# Patient Record
Sex: Female | Born: 1980 | Race: Black or African American | Hispanic: No | Marital: Single | Smoking: Current every day smoker
Health system: Southern US, Community
[De-identification: ages and names within clinical notes are randomized; demographics above are authoritative.]

## PROBLEM LIST (undated history)

## (undated) HISTORY — PX: ABDOMINAL HYSTERECTOMY: SHX81

---

## 2005-02-10 ENCOUNTER — Emergency Department: Payer: Self-pay | Admitting: Emergency Medicine

## 2009-02-17 ENCOUNTER — Emergency Department: Payer: Self-pay | Admitting: Emergency Medicine

## 2009-02-23 ENCOUNTER — Emergency Department: Payer: Self-pay | Admitting: Unknown Physician Specialty

## 2009-09-20 ENCOUNTER — Emergency Department: Payer: Self-pay | Admitting: Emergency Medicine

## 2011-04-26 IMAGING — CT CT ABD-PELV W/O CM
1 of 2 series · 15 of 32 positions shown, 19 images · non-contrast
Comparison: none

REASON FOR EXAM: (1) right flank and RLQ pain with microhematuria; (2)
right flank and RLQ pain w
COMMENTS:

[Series 2: stone · axial · 0.63mm/px · z∈[-117,+300]mm · 15 of 153 slices shown, 19 images]
[im 7/153  soft-tissue]
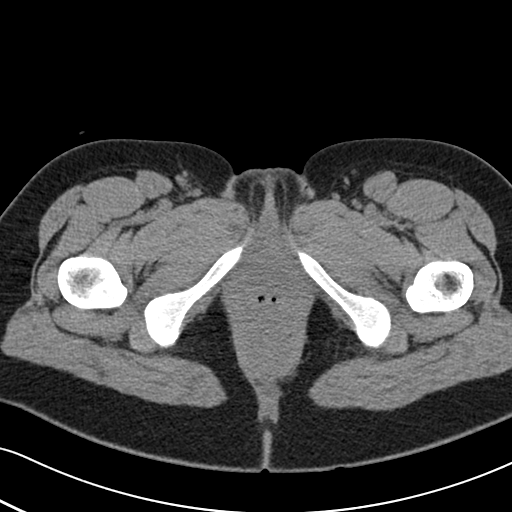
[im 7/153  bone]
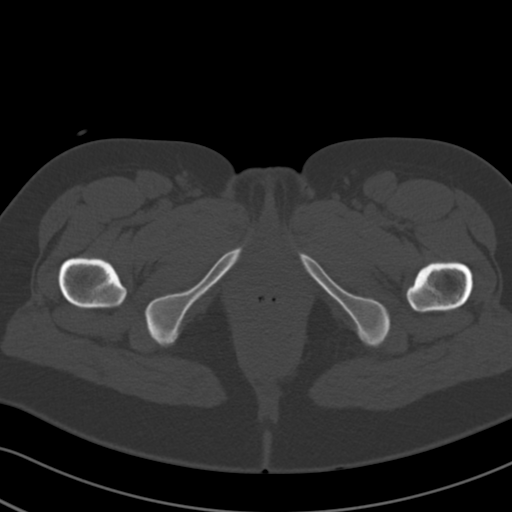
[im 19/153  soft-tissue]
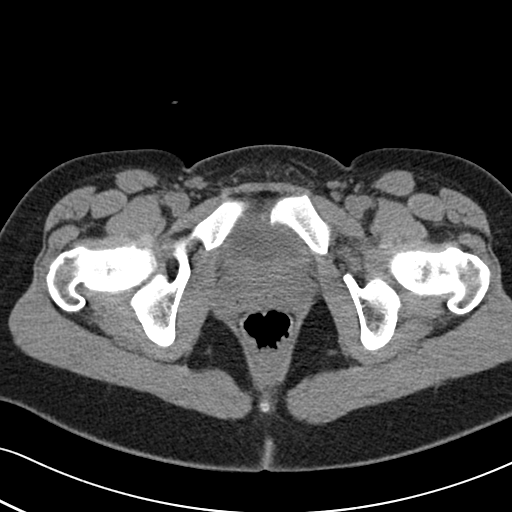
[im 31/153  soft-tissue]
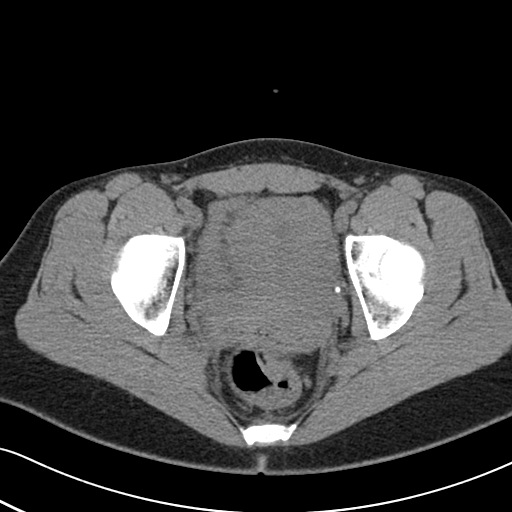
[im 43/153  soft-tissue]
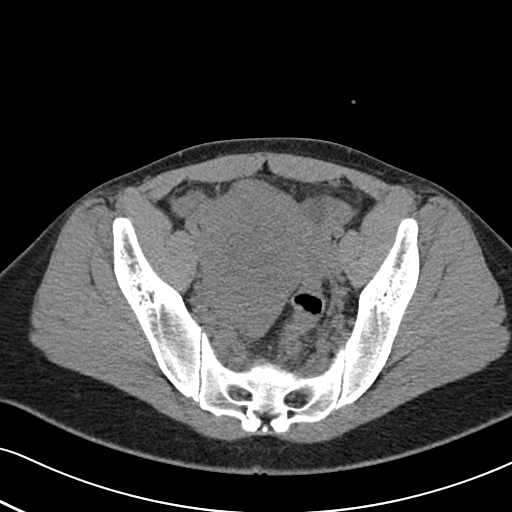
[im 55/153  soft-tissue]
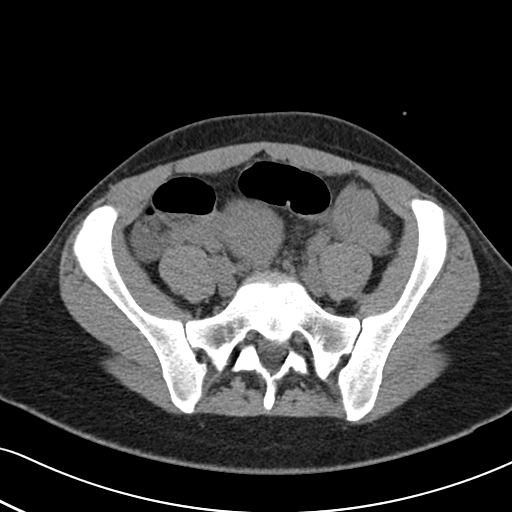
[im 67/153  soft-tissue]
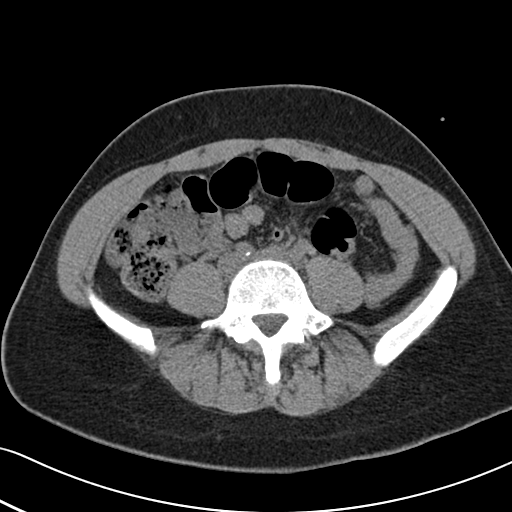
[im 80/153  soft-tissue]
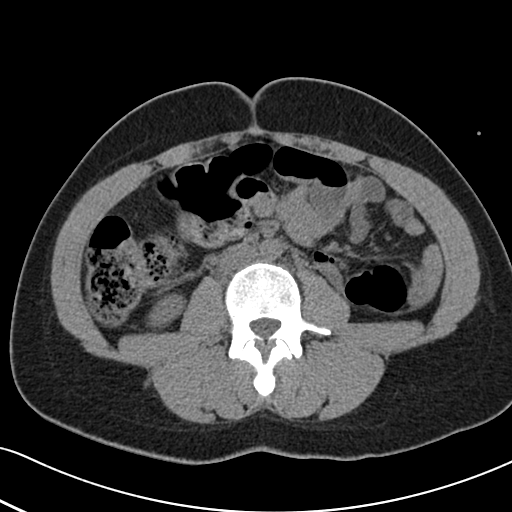
[im 86/153  soft-tissue]
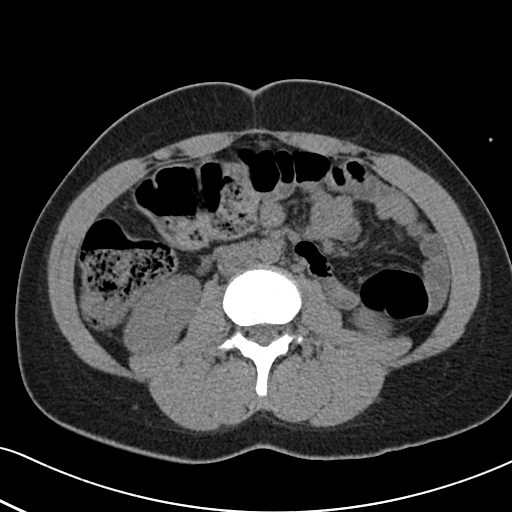
[im 98/153  soft-tissue]
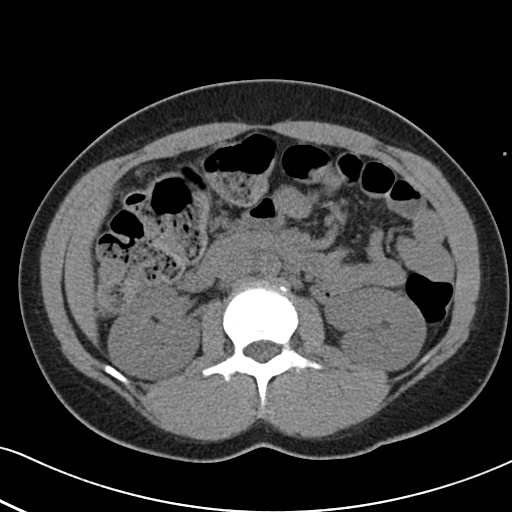
[im 98/153  bone]
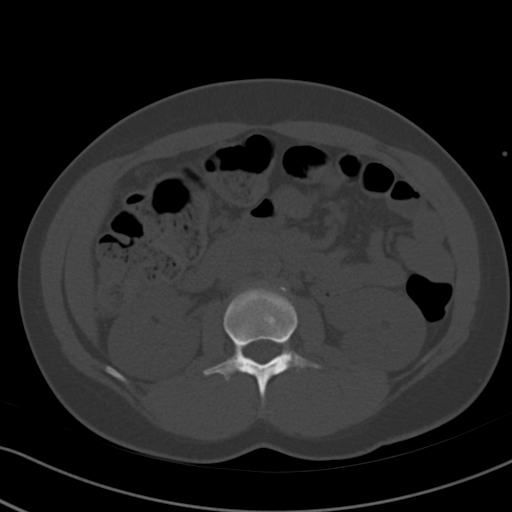
[im 110/153  soft-tissue]
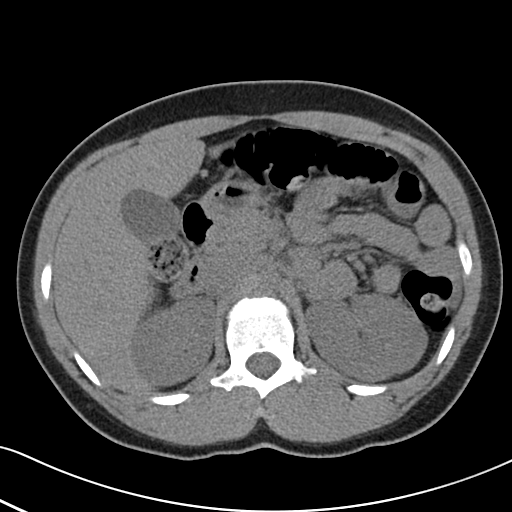
[im 122/153  soft-tissue]
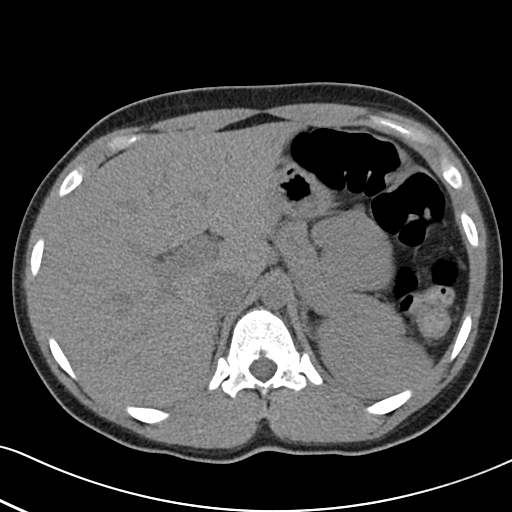
[im 128/153  lung]
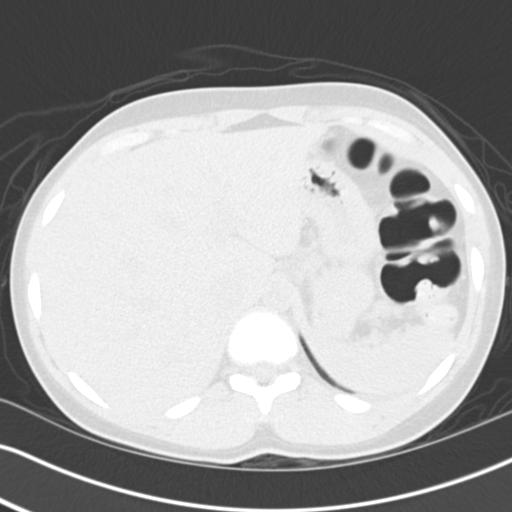
[im 134/153  soft-tissue]
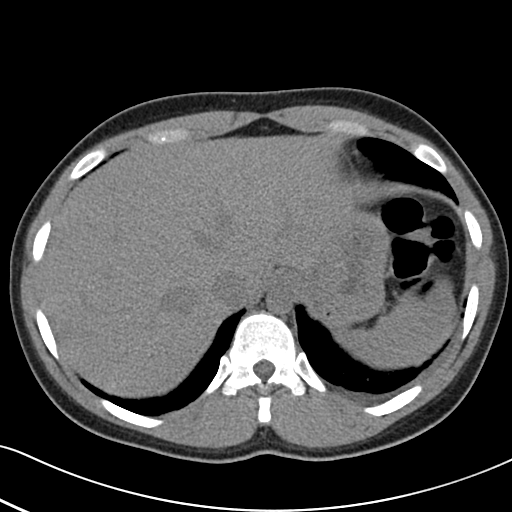
[im 134/153  lung]
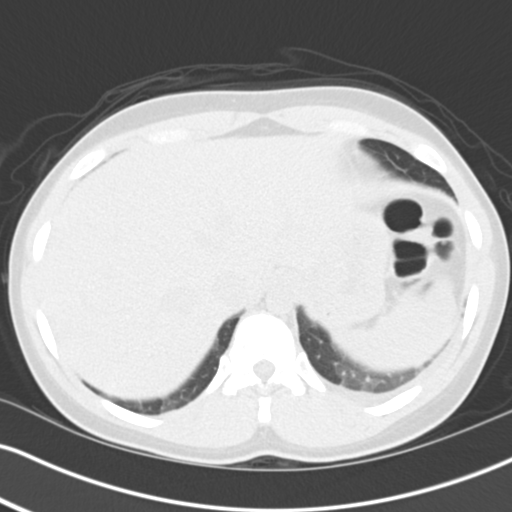
[im 140/153  lung]
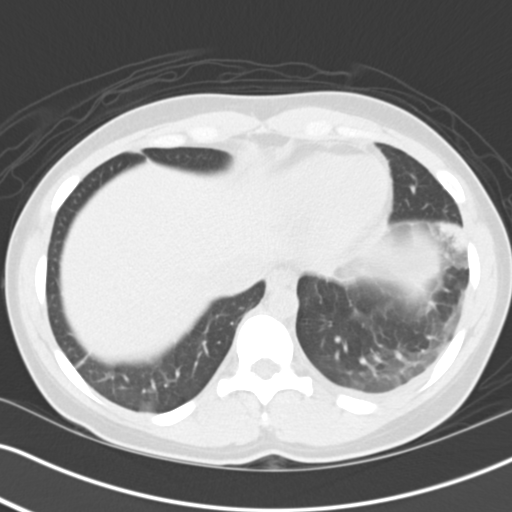
[im 146/153  soft-tissue]
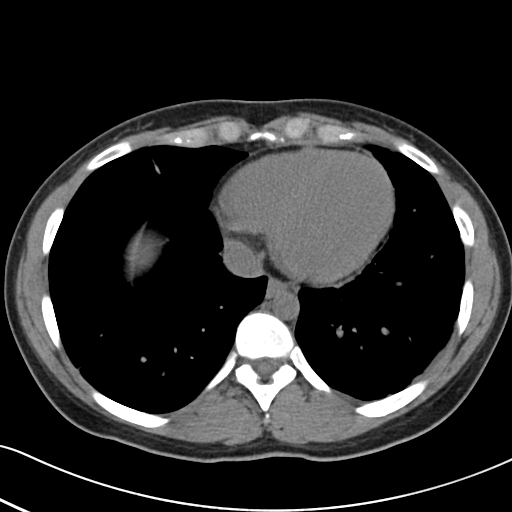
[im 146/153  lung]
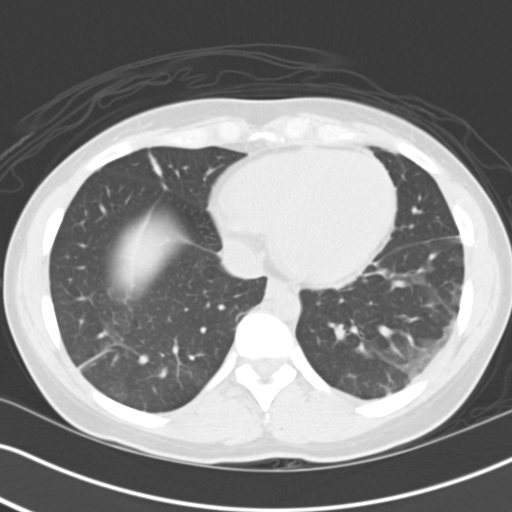

[15 of 32 positions shown; findings below may reference images not displayed]

PROCEDURE:     CT  - CT ABDOMEN AND PELVIS W[DATE] [DATE]

RESULT:     Renal stone protocol CT of the abdomen and pelvis is performed.
Images are reconstructed at 3 mm slice thickness in the axial plane. There
is no previous examination for comparison.

The kidneys show no obstruction or stones. No ureteral dilation is seen. No
gallstones of a radiopaque nature are evident. There is a moderate amount of
fecal material within the right colon. There is no abnormal bowel
distention. The liver, spleen, pancreas and adrenal glands appear
unremarkable. The aorta is normal in caliber with some scattered
atherosclerotic calcification in the right common iliac artery and in the
area of the internal iliac on the right. The uterus appears to be prominent.
Ovarian cysts are present but do not appear to be pathologically enlarged.
The largest of these approaches 2 cm in diameter on the left on image 110.
The urinary bladder is decompressed. There multiple calcifications in the
pelvic region especially in the area of the distal right ureter. It would be
difficult to exclude a nonobstructing or minimally obstructing distal right
ureteral calculus. There is no evidence of appendicitis or other acute
inflammation.
IMPRESSION: 1. Prominence of the uterus. Ovarian cysts.
2. Minimal atherosclerotic calcification.
3. LV calcifications suggestive of phleboliths. The possibility of a
nonobstructing distal right ureteral calculus is difficult to exclude. There
is no nephrolithiasis or significant obstructive change demonstrated.

## 2011-07-16 ENCOUNTER — Emergency Department: Payer: Self-pay | Admitting: Emergency Medicine

## 2011-08-06 ENCOUNTER — Emergency Department: Payer: Self-pay | Admitting: Unknown Physician Specialty

## 2013-02-18 IMAGING — CR RIGHT ANKLE - COMPLETE 3+ VIEW
1 series · 5 of 5 positions shown · non-contrast
Comparison: none

REASON FOR EXAM: pain/ injury    Flex 4
COMMENTS:   LMP: Three weeks ago

[Series 1: view not recorded · 0.17mm/px · 5 of 5 slices shown]
[im 1/5]
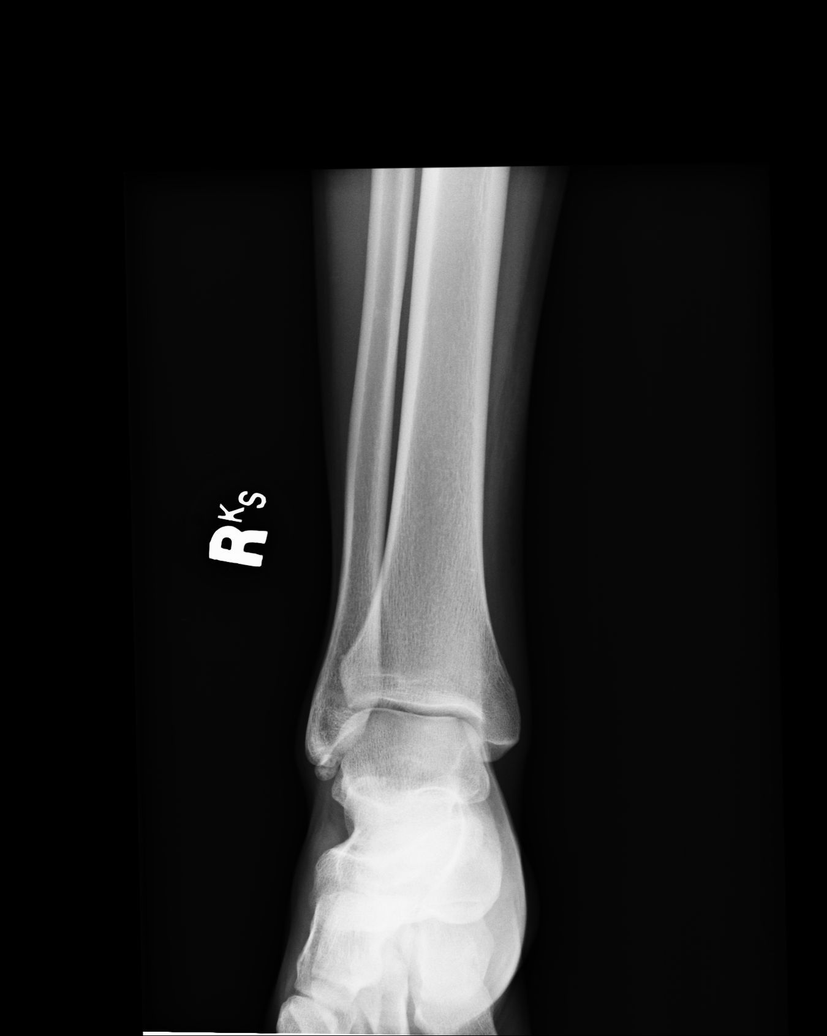
[im 2/5]
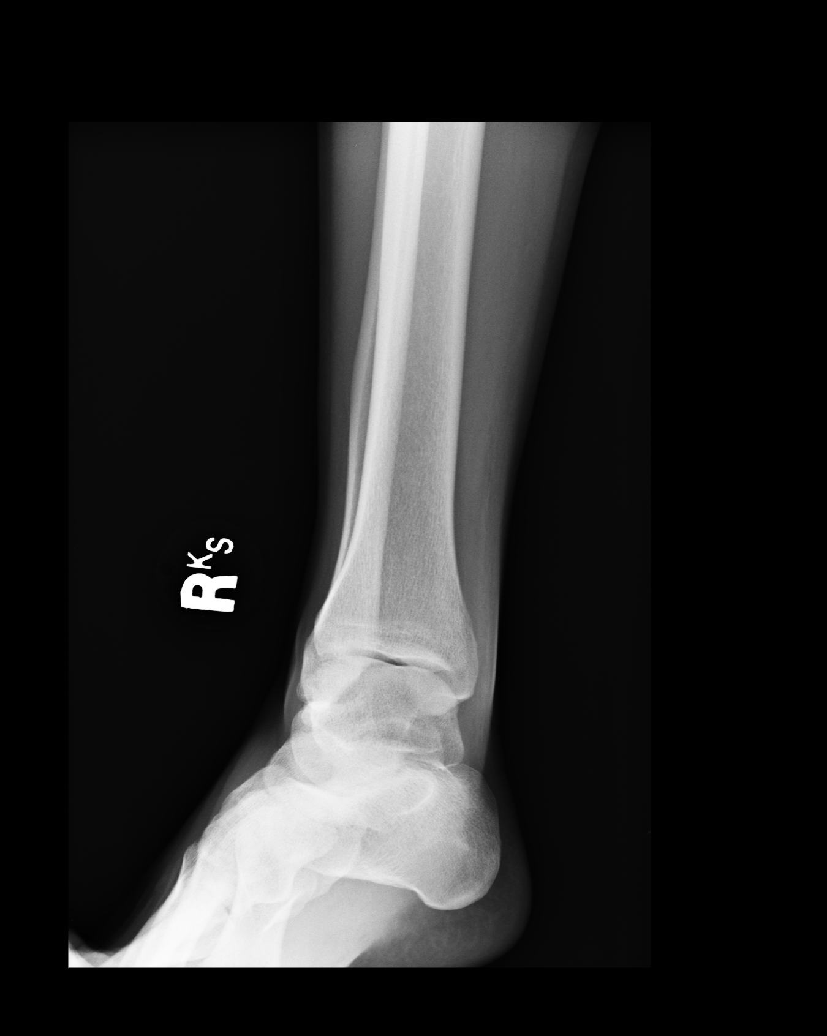
[im 3/5]
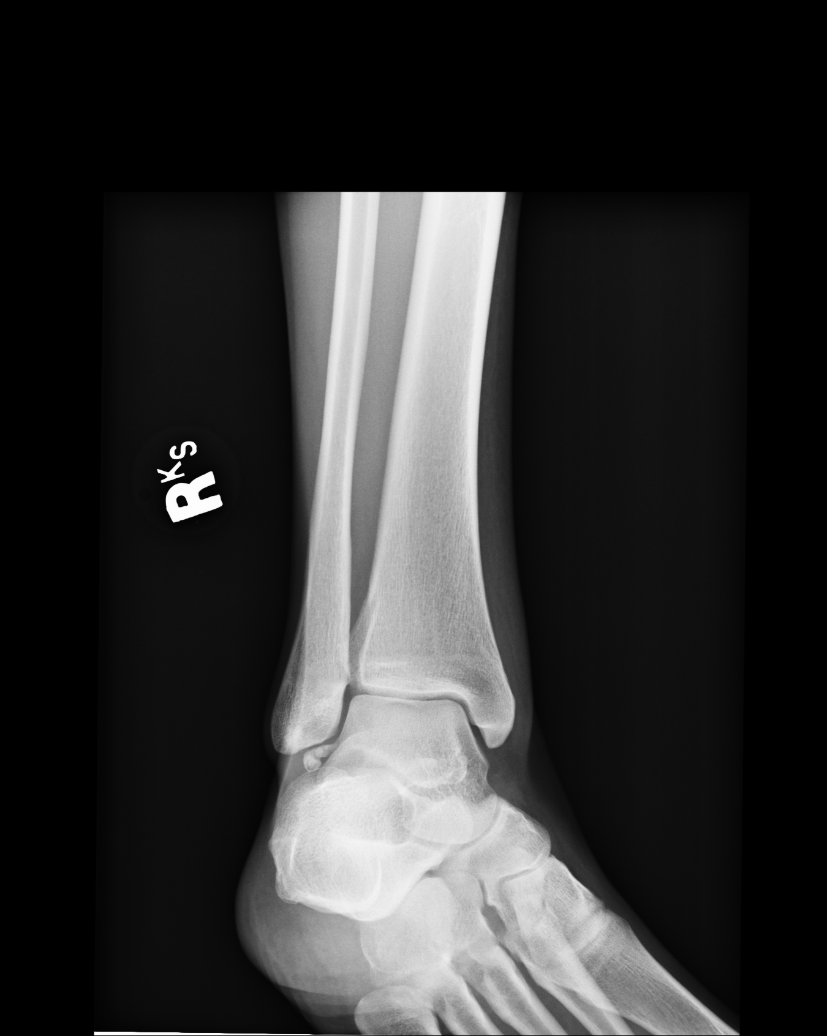
[im 4/5]
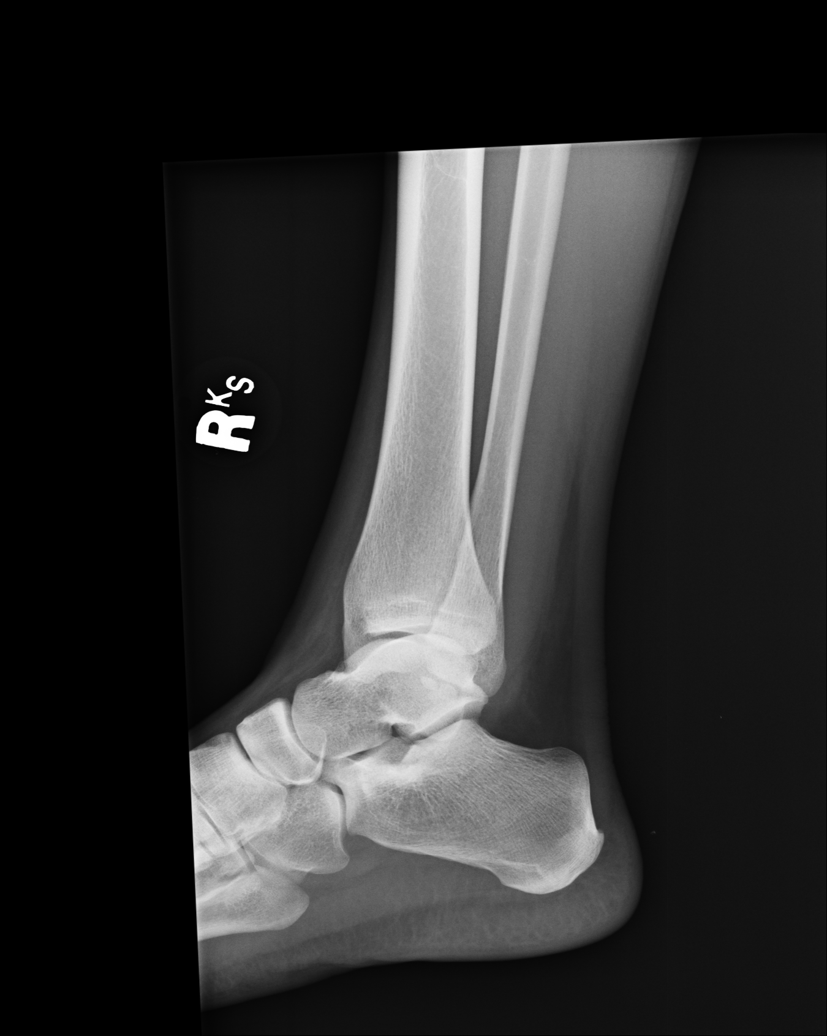
[im 5/5]
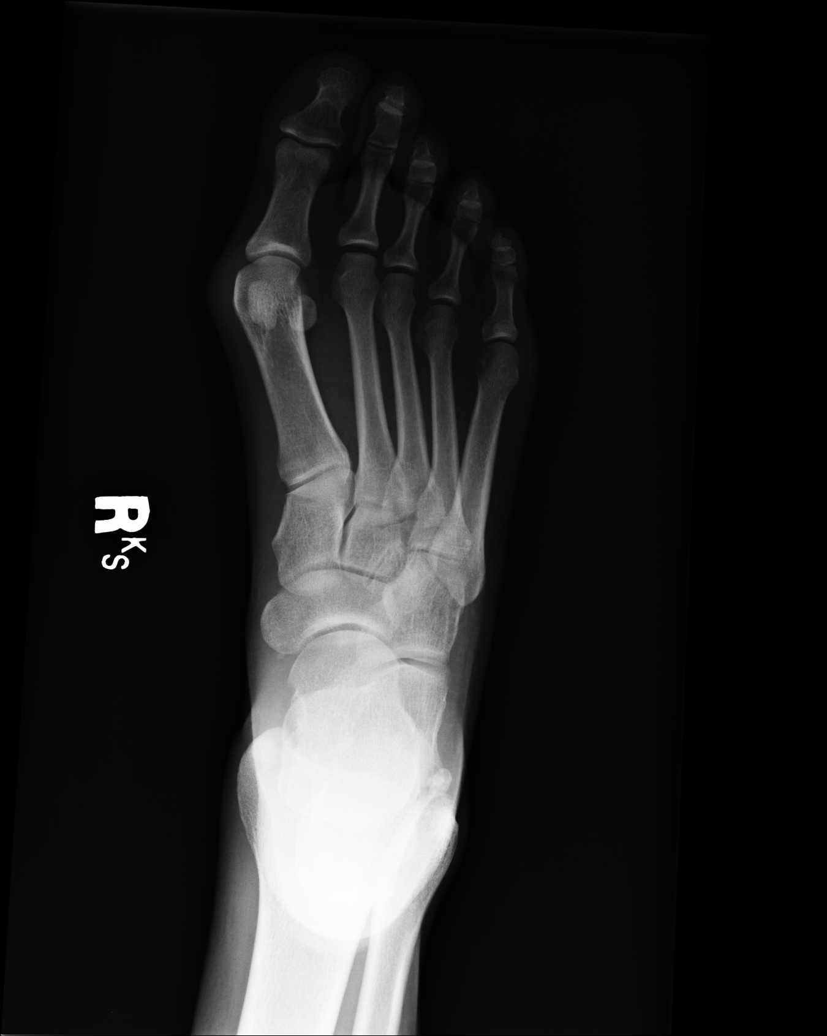

[5 of 5 positions shown; findings below may reference images not displayed]

PROCEDURE:     DXR - DXR ANKLE RIGHT COMPLETE  - July 16, 2011 [DATE]

RESULT:     Five views of the left ankle are submitted. The ankle joint
mortise is preserved. The talar dome is intact. There is bony density
inferior to the lateral malleolus which appears chronic and may reflect an
old avulsion or accessory ossicle. I do not see evidence of an acute
fracture nor dislocation. The overlying soft tissues are within the limits
of normal.
IMPRESSION: I see no acute bony abnormality of the right ankle.

## 2017-07-24 ENCOUNTER — Ambulatory Visit
Admission: EM | Admit: 2017-07-24 | Discharge: 2017-07-24 | Disposition: A | Discharge status: Home / Self Care | Payer: Self-pay | Attending: Family Medicine | Admitting: Family Medicine

## 2017-07-24 ENCOUNTER — Ambulatory Visit (INDEPENDENT_AMBULATORY_CARE_PROVIDER_SITE_OTHER): Payer: Self-pay

## 2017-07-24 ENCOUNTER — Encounter: Payer: Self-pay | Admitting: *Deleted

## 2017-07-24 DIAGNOSIS — R2241 Localized swelling, mass and lump, right lower limb: Secondary | ICD-10-CM

## 2017-07-24 DIAGNOSIS — M76811 Anterior tibial syndrome, right leg: Secondary | ICD-10-CM

## 2017-07-24 DIAGNOSIS — M79671 Pain in right foot: Secondary | ICD-10-CM

## 2017-07-24 MED ORDER — NAPROXEN 500 MG PO TABS: 500.0000 mg | ORAL_TABLET | Freq: Two times a day (BID) | ORAL | 0 refills | Status: DC

## 2017-07-24 NOTE — ED Provider Notes (Signed)
[facility]    CSN: [id number] Arrival date & time: 07/24/17  0865     History   Chief Complaint Chief Complaint  Patient presents with  . Foot Pain    HPI [name] is a 36 y.o. female.   HPI    36 year old female who presents with right foot pain that she's had for 1 week. There is no history of trauma. She has not changed her activities. 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 Is on her feet all night long.She states that she has been taking Profen 40 milligrams at a time and has "finished a bottle"over this last week. Is that she has been able to walk on it up until last night when it became much more severe and bothersome.she has noticed a dark area over the dorsum of her foot on the third and fourth metatarsal bases. She is most tender. She also has pain extending up her distal  leg but does not involve the malleolus.       History reviewed. No pertinent past medical history.  There are no active problems to display for this patient.   History reviewed. No pertinent surgical history.  OB History    No data available       Home Medications    Prior to Admission medications   Medication Sig Start Date End Date Taking? Authorizing Provider  naproxen (NAPROSYN) 500 MG tablet Take 1 tablet (500 mg total) by mouth 2 (two) times daily with a meal. 07/24/17   Lutricia Feil, PA-C    Family History Family History  Problem Relation Age of Onset  . COPD Mother   . Hypertension Mother   . Diabetes Mother   . CAD Father     Social History Social History  Substance Use Topics  . Smoking status: Current Every Day Smoker  . Smokeless tobacco: Never Used  . Alcohol use Yes     Allergies   Patient has no known allergies.   Review of Systems Review of Systems  Constitutional: Positive for activity change. Negative for appetite change, chills, fatigue and fever.  Musculoskeletal: Positive for arthralgias and gait  problem.  All other systems reviewed and are negative.    Physical Exam Triage Vital Signs ED Triage Vitals  Enc Vitals Group     BP 07/24/17 0954 124/84     Pulse Rate 07/24/17 0954 88     Resp 07/24/17 0954 16     Temp 07/24/17 0954 98.4 F (36.9 C)     Temp Source 07/24/17 0954 Oral     SpO2 07/24/17 0954 100 %     Weight 07/24/17 0955 150 lb (68 kg)     Height 07/24/17 0955  (1.676 m)     Head Circumference --      Peak Flow --      Pain Score 07/24/17 0956 10     Pain Loc --      Pain Edu? --      Excl. in GC? --    No data found.   Updated Vital Signs BP 124/84 (BP Location: Left Arm)   Pulse 88   Temp 98.4 F (36.9 C) (Oral)   Resp 16   Ht  (1.676 m)   Wt 150 lb (68 kg)   LMP 07/16/2017 Comment: denies preg  SpO2 100%   BMI 24.21 kg/m   Visual Acuity Right Eye Distance:   Left Eye Distance:  Bilateral Distance:    Right Eye Near:   Left Eye Near:    Bilateral Near:     Physical Exam  Constitutional: She is oriented to person, place, and time. She appears well-developed and well-nourished. No distress.  HENT:  Head: Normocephalic.  Eyes: Pupils are equal, round, and reactive to light.  Neck: Normal range of motion.  Pulmonary/Chest: Effort normal.  Musculoskeletal: Normal range of motion. She exhibits tenderness.  examination of the right foot shows discoloration over the dorsum of the foot extending over the third and fourth metatarsal bases. There is no significant edema. There is no breaks in the skin. Range of motion of her ankleis near normal, though complaining of discomfort at the extremes particularly with dorsi flexion. Subtalar motion is intact. Also haspain the anterior leg but not involving malleoli.. This more at the junction of the musculotendinous area. There is no warmth appreciated.  Neurological: She is alert and oriented to person, place, and time.  Skin: Skin is warm and dry. She is not diaphoretic.  Psychiatric: She  has a normal mood and affect. Her behavior is normal. Judgment and thought content normal.  Nursing note and vitals reviewed.    UC Treatments / Results  Labs (all labs ordered are listed, but only abnormal results are displayed) Labs Reviewed - No data to display  EKG  EKG Interpretation None       Radiology Dg Foot Complete Right  Result Date: 07/24/2017 CLINICAL DATA:  Dorsal foot pain and erythema. EXAM: RIGHT FOOT COMPLETE - 3+ VIEW COMPARISON:  Right ankle x-rays dated July 16, 2011. FINDINGS: No acute fracture or malalignment. Mild hallux valgus deformity. Joint spaces are preserved. Bone mineralization is normal. Mild dorsal soft tissue swelling over the midfoot. IMPRESSION: Mild dorsal midfoot soft tissue swelling. No acute osseous abnormality. Electronically Signed   By: Obie Dredge M.D.   On: 07/24/2017 11:03    Procedures Procedures (including critical care time)  Medications Ordered in UC Medications - No data to display   Initial Impression / Assessment and Plan / UC Course  I have reviewed the triage vital signs and the nursing notes.  Pertinent labs & imaging results that were available during my care of the patient were reviewed by me and considered in my medical decision making (see chart for details).     Plan: 1. Test/x-ray results and diagnosis reviewed with patient 2. rx as per orders; risks, benefits, potential side effects reviewed with patient 3. Recommend supportive treatment with rest and symptom avoidance. Using an ankle brace for support only for personal care and during sleep. Use of Naprosyn twice daily with food.If not improving in a week or to follow-up with the emerg orthopedics. 4. F/u prn if symptoms worsen or don't improve   Final Clinical Impressions(s) / UC Diagnoses   Final diagnoses:  Anterior tibial tendonitis, right    New Prescriptions New Prescriptions   NAPROXEN (NAPROSYN) 500 MG TABLET    Take 1 tablet (500 mg  total) by mouth 2 (two) times daily with a meal.     Controlled Substance Prescriptions [place] Controlled Substance Registry consulted? Not Applicable   Lutricia Feil, PA-C 07/24/17 1215

## 2017-07-24 NOTE — ED Triage Notes (Signed)
Gradual onset right foot pain, edema, and redness x1 week. Denies injury.

## 2019-06-25 ENCOUNTER — Ambulatory Visit
Admission: EM | Admit: 2019-06-25 | Discharge: 2019-06-25 | Disposition: A | Discharge status: Home / Self Care | Payer: Self-pay | Attending: Family Medicine | Admitting: Family Medicine

## 2019-06-25 ENCOUNTER — Encounter: Payer: Self-pay | Admitting: Emergency Medicine

## 2019-06-25 ENCOUNTER — Other Ambulatory Visit: Payer: Self-pay

## 2019-06-25 DIAGNOSIS — Z042 Encounter for examination and observation following work accident: Secondary | ICD-10-CM

## 2019-06-25 DIAGNOSIS — W268XXA Contact with other sharp object(s), not elsewhere classified, initial encounter: Secondary | ICD-10-CM

## 2019-06-25 DIAGNOSIS — S61512A Laceration without foreign body of left wrist, initial encounter: Secondary | ICD-10-CM

## 2019-06-25 DIAGNOSIS — Z23 Encounter for immunization: Secondary | ICD-10-CM

## 2019-06-25 MED ORDER — TETANUS-DIPHTH-ACELL PERTUSSIS 5-2.5-18.5 LF-MCG/0.5 IM SUSP
0.5000 mL | Freq: Once | INTRAMUSCULAR | Status: AC
  Administered 2019-06-25: 0.5 mL via INTRAMUSCULAR

## 2019-06-25 NOTE — Discharge Instructions (Signed)
Keep area clean  Over the counter topical antibiotic twice daily Monitor for any signs or symptoms of infection Follow up in 7 days for suture removal

## 2019-06-25 NOTE — ED Triage Notes (Signed)
Patient in today for a W/C injury 06/25/19. Patient states she was cutting a box with a box cutter and when she got to the end of the box she cut her left wrist. Patient states her last tetanus was ~2 months ago at [facility].

## 2019-06-25 NOTE — ED Provider Notes (Signed)
[facility]    CSN: [id number] Arrival date & time: 06/25/19  1737      History   Chief Complaint Chief Complaint  Patient presents with  . Extremity Laceration    W/C left wrist    HPI Makayla Bowman is a 38 y.o. female.   38 yo female with a c/o left wrist laceration after cutting it accidentally with a box cutter at work. States she was cutting a box when she accidentally cut her wrist. Does not recall her last tetanus. Patient states she's otherwise healthy.      History reviewed. No pertinent past medical history.  There are no active problems to display for this patient.   Past Surgical History:  Procedure Laterality Date  . ABDOMINAL HYSTERECTOMY      OB History   No obstetric history on file.      Home Medications    Prior to Admission medications   Medication Sig Start Date End Date Taking? Authorizing Provider  naproxen (NAPROSYN) 500 MG tablet Take 1 tablet (500 mg total) by mouth 2 (two) times daily with a meal. 07/24/17  Yes Lutricia Feiloemer, William P, PA-C    Family History Family History  Problem Relation Age of Onset  . COPD Mother   . Hypertension Mother   . Diabetes Mother   . CAD Father     Social History Social History   Tobacco Use  . Smoking status: Current Every Day Smoker    Packs/day: 0.50    Years: 5.00    Pack years: 2.50    Types: Cigarettes  . Smokeless tobacco: Never Used  Substance Use Topics  . Alcohol use: Yes    Comment: social  . Drug use: No     Allergies   Patient has no known allergies.   Review of Systems Review of Systems   Physical Exam Triage Vital Signs ED Triage Vitals [06/25/19 1758]  Enc Vitals Group     BP 131/88     Pulse Rate 77     Resp 18     Temp 98.3 F (36.8 C)     Temp Source Oral     SpO2 100 %     Weight 151 lb (68.5 kg)     Height 5\' 6"  (1.676 m)     Head Circumference      Peak Flow      Pain Score 6     Pain Loc      Pain Edu?      Excl. in GC?    No  data found.  Updated Vital Signs BP 131/88 (BP Location: Right Arm)   Pulse 77   Temp 98.3 F (36.8 C) (Oral)   Resp 18   Ht 5\' 6"  (1.676 m)   Wt 68.5 kg   LMP 07/16/2017 Comment: denies preg  SpO2 100%   BMI 24.37 kg/m   Visual Acuity Right Eye Distance:   Left Eye Distance:   Bilateral Distance:    Right Eye Near:   Left Eye Near:    Bilateral Near:     Physical Exam Vitals signs and nursing note reviewed.  Constitutional:      General: She is not in acute distress.    Appearance: She is not toxic-appearing or diaphoretic.  Musculoskeletal:     Left wrist: She exhibits laceration (3cm laceration on inner (palmar) aspect of wrist; normal range of motion and strength; neurovascularly intact). She exhibits normal range of motion, no tenderness, no  bony tenderness, no swelling, no effusion and no crepitus.  Neurological:     Mental Status: She is alert.      UC Treatments / Results  Labs (all labs ordered are listed, but only abnormal results are displayed) Labs Reviewed - No data to display  EKG   Radiology No results found.  Procedures Laceration Repair  Date/Time: 06/25/2019 8:36 PM Performed by: Norval Gable, MD Authorized by: Norval Gable, MD   Consent:    Consent obtained:  Verbal   Consent given by:  Patient   Risks discussed:  Infection, need for additional repair, nerve damage, poor wound healing, poor cosmetic result, pain, retained foreign body, tendon damage and vascular damage   Alternatives discussed:  No treatment Anesthesia (see MAR for exact dosages):    Anesthesia method:  Topical application and local infiltration   Topical anesthetic:  LET   Local anesthetic:  Lidocaine 1% w/o epi Laceration details:    Location:  Hand   Hand location:  L wrist   Length (cm):  3 Repair type:    Repair type:  Simple Pre-procedure details:    Preparation:  Patient was prepped and draped in usual sterile fashion Exploration:    Hemostasis  achieved with:  LET and direct pressure   Wound exploration: wound explored through full range of motion and entire depth of wound probed and visualized     Wound extent: areolar tissue violated     Contaminated: no   Treatment:    Area cleansed with:  Betadine   Amount of cleaning:  Standard   Irrigation solution:  Sterile water   Irrigation method:  Syringe   Foreign body removal: no foreign bodies visualized.   Skin repair:    Repair method:  Sutures   Suture size:  5-0   Suture material:  Nylon   Suture technique:  Simple interrupted   Number of sutures:  5 Approximation:    Approximation:  Close Post-procedure details:    Dressing:  Antibiotic ointment and non-adherent dressing   Patient tolerance of procedure:  Tolerated well, no immediate complications   (including critical care time)  Medications Ordered in UC Medications  Tdap (BOOSTRIX) injection 0.5 mL (0.5 mLs Intramuscular Given 06/25/19 1912)    Initial Impression / Assessment and Plan / UC Course  I have reviewed the triage vital signs and the nursing notes.  Pertinent labs & imaging results that were available during my care of the patient were reviewed by me and considered in my medical decision making (see chart for details).      Final Clinical Impressions(s) / UC Diagnoses   Final diagnoses:  Laceration of left wrist, initial encounter     Discharge Instructions     Keep area clean  Over the counter topical antibiotic twice daily Monitor for any signs or symptoms of infection Follow up in 7 days for suture removal    ED Prescriptions    None      1. diagnosis reviewed with patient 2. tdap given 3. Recommend supportive treatment with routine wound care 4. Follow-up in 7 days for suture removal or sooner prn   Controlled Substance Prescriptions [place] Controlled Substance Registry consulted? Not Applicable   Norval Gable, MD 06/25/19 2038

## 2019-07-02 ENCOUNTER — Other Ambulatory Visit: Payer: Self-pay

## 2019-07-02 ENCOUNTER — Ambulatory Visit
Admission: EM | Admit: 2019-07-02 | Discharge: 2019-07-02 | Disposition: A | Discharge status: Home / Self Care | Payer: Self-pay

## 2019-07-02 DIAGNOSIS — S61512D Laceration without foreign body of left wrist, subsequent encounter: Secondary | ICD-10-CM

## 2019-07-02 DIAGNOSIS — Z4802 Encounter for removal of sutures: Secondary | ICD-10-CM

## 2019-07-02 NOTE — ED Provider Notes (Signed)
[facility] ____________________________________________  Time seen: Approximately 6:45 PM  I have reviewed the triage vital signs and the nursing notes.   HISTORY  Chief Complaint Suture / Staple Removal   HPI Makayla Bowman is a 38 y.o. female presenting for suture removal and wound reevaluation.  Patient was seen in urgent care 1 week ago after sustaining a laceration to left wrist from a box cutter from work.  Worker's Compensation injury.  States area has been healing well without complication.  Denies fevers, drainage, redness, pain, paresthesias or decreased range of motion.  Denies other complaints.  Tetanus immunization up-to-date.    No past medical history on file.  There are no active problems to display for this patient.   Past Surgical History:  Procedure Laterality Date  . ABDOMINAL HYSTERECTOMY       No current facility-administered medications for this encounter.   Current Outpatient Medications:  .  naproxen (NAPROSYN) 500 MG tablet, Take 1 tablet (500 mg total) by mouth 2 (two) times daily with a meal., Disp: 60 tablet, Rfl: 0  Allergies Patient has no known allergies.  Family History  Problem Relation Age of Onset  . COPD Mother   . Hypertension Mother   . Diabetes Mother   . CAD Father     Social History Social History   Tobacco Use  . Smoking status: Current Every Day Smoker    Packs/day: 0.50    Years: 5.00    Pack years: 2.50    Types: Cigarettes  . Smokeless tobacco: Never Used  Substance Use Topics  . Alcohol use: Yes    Comment: social  . Drug use: No    Review of Systems Constitutional: No fever/chills Skin: Sutures to left wrist.  ____________________________________________   PHYSICAL EXAM:  VITAL SIGNS: ED Triage Vitals [07/02/19 1828]  Enc Vitals Group     BP 127/81     Pulse Rate 88     Resp 18     Temp 98.3 F (36.8 C)     Temp Source Oral     SpO2 100 %     Weight      Height      Head  Circumference      Peak Flow      Pain Score 0     Pain Loc      Pain Edu?      Excl. in GC?     Constitutional: Alert and oriented. Well appearing and in no acute distress. ENT      Head: Normocephalic and atraumatic. Cardiovascular:   Good peripheral circulation. Respiratory: Normal respiratory effort without tachypnea nor retractions.  Musculoskeletal: Steady gait. Neurologic:  Normal speech and language. Skin:  Skin is warm, dry.  Except: Volar aspect left wrist healing laceration with 5 sutures intact, overall well approximated with minimal dehiscence, no erythema, nontender, no drainage, no edema. Psychiatric: Mood and affect are normal. Speech and behavior are normal. Patient exhibits appropriate insight and judgment   ___________________________________________   LABS (all labs ordered are listed, but only abnormal results are displayed)  Labs Reviewed - No data to display   PROCEDURES Procedures   X 5 Sutures removed by myself, patient tolerated well, minimal dehiscence, Steri-Strip applied and directed and Steri-Strips use at home.  INITIAL IMPRESSION / ASSESSMENT AND PLAN / ED COURSE  Pertinent labs & imaging results that were available during my care of the patient were reviewed by me and considered in my medical decision making (see  chart for details).  Well-appearing patient.  Sutures removed as above.  Steri-Strips given, continued wound care directions given.  Worker's Compensation note given to return to work with no restrictions. Discussed follow up and return parameters including no resolution or any worsening concerns. Patient verbalized understanding and agreed to plan.   ____________________________________________   FINAL CLINICAL IMPRESSION(S) / ED DIAGNOSES  Final diagnoses:  Visit for suture removal     ED Discharge Orders    None       Note: This dictation was prepared with Dragon dictation along with smaller phrase technology. Any  transcriptional errors that result from this process are unintentional.         Marylene Land, NP 07/02/19 1849

## 2019-07-02 NOTE — ED Triage Notes (Signed)
Pt presents to [facility] for suture removal. She has sutures placed on 06/25/19 on her left wrist. She has 5 sutures.  5 sutures removed. Max Sane applied steristrips

## 2019-07-02 NOTE — Discharge Instructions (Addendum)
Continue to keep clean. Steri-Strips.  Topical antibiotic.  Monitor.

## 2023-02-05 ENCOUNTER — Ambulatory Visit
Admission: EM | Admit: 2023-02-05 | Discharge: 2023-02-05 | Disposition: A | Discharge status: Home / Self Care | Payer: 59 | Attending: Emergency Medicine | Admitting: Emergency Medicine

## 2023-02-05 ENCOUNTER — Ambulatory Visit (INDEPENDENT_AMBULATORY_CARE_PROVIDER_SITE_OTHER): Payer: 59

## 2023-02-05 DIAGNOSIS — S93501A Unspecified sprain of right great toe, initial encounter: Secondary | ICD-10-CM

## 2023-02-05 MED ORDER — TRAMADOL HCL 50 MG PO TABS: 50.0000 mg | ORAL_TABLET | Freq: Four times a day (QID) | ORAL | 0 refills | Status: AC | PRN

## 2023-02-05 NOTE — ED Provider Notes (Signed)
[facility]    CSN: [id number] Arrival date & time: 02/05/23  1047      History   Chief Complaint Chief Complaint  Patient presents with   Foot Injury    HPI Makayla Bowman is a 42 y.o. female.   HPI  42 year old female with no significant past medical history presents for evaluation of pain and swelling to her right foot x 1 day.  She states that she twisted her foot while walking down steps carrying a shelf.  She has taken ibuprofen without relief.  She is able to bear weight and ambulate but with pain and difficulty.  History reviewed. No pertinent past medical history.  There are no problems to display for this patient.   Past Surgical History:  Procedure Laterality Date   ABDOMINAL HYSTERECTOMY      OB History   No obstetric history on file.      Home Medications    Prior to Admission medications   Medication Sig Start Date End Date Taking? Authorizing Provider  traMADol (ULTRAM) 50 MG tablet Take 1 tablet (50 mg total) by mouth every 6 (six) hours as needed. 02/05/23  Yes Becky Augusta, NP    Family History Family History  Problem Relation Age of Onset   COPD Mother    Hypertension Mother    Diabetes Mother    CAD Father     Social History Social History   Tobacco Use   Smoking status: Every Day    Packs/day: 0.50    Years: 5.00    Additional pack years: 0.00    Total pack years: 2.50    Types: Cigarettes   Smokeless tobacco: Never  Vaping Use   Vaping Use: Never used  Substance Use Topics   Alcohol use: Yes    Comment: social   Drug use: No     Allergies   Patient has no known allergies.   Review of Systems Review of Systems  Constitutional:  Negative for fever.  Musculoskeletal:  Positive for arthralgias, joint swelling and myalgias.  Skin:  Positive for color change.  Neurological:  Negative for weakness and numbness.  Hematological: Negative.   Psychiatric/Behavioral: Negative.       Physical Exam Triage  Vital Signs ED Triage Vitals [02/05/23 1235]  Enc Vitals Group     BP      Pulse      Resp 16     Temp      Temp Source Oral     SpO2      Weight      Height      Head Circumference      Peak Flow      Pain Score      Pain Loc      Pain Edu?      Excl. in GC?    No data found.  Updated Vital Signs BP 101/72 (BP Location: Left Arm)   Pulse 85   Temp 98.6 F (37 C) (Oral)   Resp 16   Ht 5\' 7"  (1.702 m)   Wt 153 lb (69.4 kg)   LMP 07/16/2017 Comment: denies preg  SpO2 100%   BMI 23.96 kg/m   Visual Acuity Right Eye Distance:   Left Eye Distance:   Bilateral Distance:    Right Eye Near:   Left Eye Near:    Bilateral Near:     Physical Exam Vitals and nursing note reviewed.  Constitutional:      Appearance: Normal  appearance.  Musculoskeletal:        General: Swelling, tenderness and signs of injury present. No deformity. Normal range of motion.  Skin:    General: Skin is warm and dry.     Capillary Refill: Capillary refill takes less than 2 seconds.     Findings: Erythema present.  Neurological:     General: No focal deficit present.     Mental Status: She is alert and oriented to person, place, and time.      UC Treatments / Results  Labs (all labs ordered are listed, but only abnormal results are displayed) Labs Reviewed - No data to display  EKG   Radiology DG Foot Complete Right  Result Date: 02/05/2023 CLINICAL DATA:  Right foot pain EXAM: RIGHT FOOT COMPLETE - 3+ VIEW COMPARISON:  07/24/2017 FINDINGS: There is no evidence of fracture or dislocation. Chronic ossifications adjacent to the lateral malleolus. Hallux valgus deformity. There is no evidence of arthropathy or other focal bone abnormality. Soft tissue thickening adjacent to the first MTP joint. IMPRESSION: 1. No acute osseous abnormality. 2. Hallux valgus deformity with soft tissue thickening adjacent to the first MTP joint. Electronically Signed   By: Duanne Guess D.O.   On:  02/05/2023 13:13    Procedures Procedures (including critical care time)  Medications Ordered in UC Medications - No data to display  Initial Impression / Assessment and Plan / UC Course  I have reviewed the triage vital signs and the nursing notes.  Pertinent labs & imaging results that were available during my care of the patient were reviewed by me and considered in my medical decision making (see chart for details).   Patient is a pleasant, nontoxic-appearing 42 year old female presenting for evaluation of pain, swelling, and redness to her right foot x 2 days.  She reports that she was walking down 4 small steps and when she got the last step her foot slipped off causing her toes to curl under.  She has had pain, inflammation, and swelling to her MCP of her right great toe since then and she also difficulty walking secondary to the pain.  She has been using ibuprofen at home without any improvement.  On exam she has 2+ DP and PT pulses and she has full range of motion of her foot, ankle, and toes.  She also has full sensation.  Radiograph was obtained at triage which shows no acute osseous abnormality but there is a hallux valgus deformity with soft tissue thickening adjacent to the first MTP joint.  This is the same area that is warm, red, and tender.  Given that patient has no bony abnormality on x-ray I suspect that she has sprained her right great toe.  I will order a postop shoe and refer her to podiatry for further evaluation and management.  I have advised her to keep her foot elevated is much as possible and to apply ice for 20 minutes at a time 2-3 times a day to help with pain and swelling.  She can use over-the-counter Tylenol and or ibuprofen as needed for mild to moderate pain and I will prescribe tramadol that she can use for severe pain.  A review of PDMP does not show any open prescriptions for controlled substances.  I will also write the patient to be out of work for the next 2  days.  Final Clinical Impressions(s) / UC Diagnoses   Final diagnoses:  Sprain of right great toe, initial encounter  Discharge Instructions      Keep your right foot elevated is much as possible and apply ice to the area of swelling and redness for 20 minutes at a time 2-3 times a day.  Make sure you have a cloth between your skin and the ice so you do not cause any skin damage.  Continue to use over-the-counter Tylenol and/or ibuprofen according to the package instructions as needed for mild to moderate pain.  You may use the tramadol that I prescribed as needed for severe pain.  You may take 1 tablet every 6 hours.  Be mindful that this medication will make you drowsy so do not drink alcohol or drive if you take it.  Also do not operate any heavy machinery.  I have made a referral to podiatry for further evaluation and management should your symptoms not improve.  If you have any increase in redness, swelling, pain, develop red streaks going up your leg, or fever please go to the ER for evaluation.     ED Prescriptions     Medication Sig Dispense Auth. Provider   traMADol (ULTRAM) 50 MG tablet Take 1 tablet (50 mg total) by mouth every 6 (six) hours as needed. 15 tablet Becky Augusta, NP      I have reviewed the PDMP during this encounter.   Becky Augusta, NP 02/05/23 1354

## 2023-02-05 NOTE — Discharge Instructions (Addendum)
Keep your right foot elevated is much as possible and apply ice to the area of swelling and redness for 20 minutes at a time 2-3 times a day.  Make sure you have a cloth between your skin and the ice so you do not cause any skin damage.  Continue to use over-the-counter Tylenol and/or ibuprofen according to the package instructions as needed for mild to moderate pain.  You may use the tramadol that I prescribed as needed for severe pain.  You may take 1 tablet every 6 hours.  Be mindful that this medication will make you drowsy so do not drink alcohol or drive if you take it.  Also do not operate any heavy machinery.  I have made a referral to podiatry for further evaluation and management should your symptoms not improve.  If you have any increase in redness, swelling, pain, develop red streaks going up your leg, or fever please go to the ER for evaluation.

## 2023-02-05 NOTE — ED Triage Notes (Signed)
Pt c/o R foot pain & edema x1 day, states she was carrying a shelf down some steps & foot twisted. Has tried ibuprofen w/o relief.
